# Patient Record
Sex: Female | Born: 1990 | Race: White | Hispanic: No | Smoking: Former smoker
Health system: Southern US, Community
[De-identification: ages and names within clinical notes are randomized; demographics above are authoritative.]

---

## 2016-06-15 ENCOUNTER — Emergency Department (HOSPITAL_COMMUNITY)
Admission: EM | Admit: 2016-06-15 | Discharge: 2016-06-15 | Disposition: A | Discharge status: Home / Self Care | Payer: Self-pay | Attending: Emergency Medicine | Admitting: Emergency Medicine

## 2016-06-15 ENCOUNTER — Emergency Department (HOSPITAL_COMMUNITY): Payer: Self-pay

## 2016-06-15 ENCOUNTER — Encounter (HOSPITAL_COMMUNITY): Payer: Self-pay

## 2016-06-15 DIAGNOSIS — R0789 Other chest pain: Secondary | ICD-10-CM | POA: Insufficient documentation

## 2016-06-15 DIAGNOSIS — Z87891 Personal history of nicotine dependence: Secondary | ICD-10-CM | POA: Insufficient documentation

## 2016-06-15 LAB — CBC
HEMATOCRIT: 41 % (ref 36.0–46.0)
HEMOGLOBIN: 13.1 g/dL (ref 12.0–15.0)
MCH: 23 pg — ABNORMAL LOW (ref 26.0–34.0)
MCHC: 32 g/dL (ref 30.0–36.0)
MCV: 72.1 fL — ABNORMAL LOW (ref 78.0–100.0)
Platelets: 327 10*3/uL (ref 150–400)
RBC: 5.69 MIL/uL — AB (ref 3.87–5.11)
RDW: 14.9 % (ref 11.5–15.5)
WBC: 9.7 10*3/uL (ref 4.0–10.5)

## 2016-06-15 LAB — BASIC METABOLIC PANEL
ANION GAP: 11 (ref 5–15)
BUN: 6 mg/dL (ref 6–20)
CHLORIDE: 104 mmol/L (ref 101–111)
CO2: 22 mmol/L (ref 22–32)
Calcium: 9.3 mg/dL (ref 8.9–10.3)
Creatinine, Ser: 0.85 mg/dL (ref 0.44–1.00)
GFR calc Af Amer: >60 mL/min (ref >60)
Glucose, Bld: 82 mg/dL (ref 65–99)
POTASSIUM: 3.9 mmol/L (ref 3.5–5.1)
SODIUM: 137 mmol/L (ref 135–145)

## 2016-06-15 LAB — D-DIMER, QUANTITATIVE: D-Dimer, Quant: 0.37 ug/mL-FEU (ref 0.00–0.50)

## 2016-06-15 MED ORDER — IBUPROFEN 400 MG PO TABS
600.0000 mg | ORAL_TABLET | Freq: Once | ORAL | Status: AC
  Administered 2016-06-15: 600 mg via ORAL
  Filled 2016-06-15: qty 1

## 2016-06-15 NOTE — ED Triage Notes (Signed)
Onset this morning while at work left sided chest pain constant.  Breathing in and out makes worse.  Nothing makes better.  No recent cough and cold symptoms.  Pt is tearful stating "it just hurts really bad".

## 2016-06-15 NOTE — ED Provider Notes (Signed)
[facility] Provider Note   CSN: [id number] Arrival date & time: 06/15/16  1209     History   Chief Complaint Chief Complaint  Patient presents with  . Chest Pain  . Anxiety    HPI Shirley Garrett is a 26 y.o. female who presents with chest pain. No significant PMH. She states the pain started acutely this morning. It is on the central and left side of her chest. It feels like "someone punched me in the chest". Breathing in and movement makes it worse. Nothing has made it better. She does report an anxiety attack when she was crying and had the chest pain. Also reports intermittent L calf pain for the past couple weeks but none currently. Denies fever, chills, SOB, cough, abdominal pain, N/V. She is on oral BC which she has been on since she was 13. She also endorses a lot of stress recently but states that she is more calm now and the pain is still there although it is better. Denies heavy lifting or recent exercise. Some dizziness without syncope. Reports increased stress due to recent break up, stress at her job, and a recent move.  HPI  History reviewed. No pertinent past medical history.  There are no active problems to display for this patient.   History reviewed. No pertinent surgical history.  OB History    No data available       Home Medications    Prior to Admission medications   Not on File    Family History History reviewed. No pertinent family history.  Social History Social History  Substance Use Topics  . Smoking status: Former Smoker    Types: Cigarettes  . Smokeless tobacco: Never Used  . Alcohol use No     Allergies   Patient has no known allergies.   Review of Systems Review of Systems  Constitutional: Negative for chills and fever.  Respiratory: Negative for cough and shortness of breath.   Cardiovascular: Positive for chest pain. Negative for palpitations and leg swelling.  Gastrointestinal: Negative for abdominal pain,  nausea and vomiting.  Neurological: Positive for dizziness. Negative for syncope and light-headedness.  All other systems reviewed and are negative.    Physical Exam Updated Vital Signs BP 135/99 (BP Location: Left Arm)   Pulse 93   Temp 98.9 F (37.2 C) (Oral)   Resp 24   Ht 5' (1.524 m)   Wt 99.8 kg   LMP 05/13/2016   SpO2 100%   BMI 42.97 kg/m   Physical Exam  Constitutional: She is oriented to person, place, and time. She appears well-developed and well-nourished. No distress.  HENT:  Head: Normocephalic and atraumatic.  Eyes: Conjunctivae are normal. Pupils are equal, round, and reactive to light. Right eye exhibits no discharge. Left eye exhibits no discharge. No scleral icterus.  Neck: Normal range of motion.  Cardiovascular: Normal rate and regular rhythm.  Exam reveals no gallop and no friction rub.   No murmur heard. Pulmonary/Chest: Effort normal. No respiratory distress. She has no wheezes. She has no rales. She exhibits tenderness (Sternal and left sided chest tenderness).  Abdominal: Soft. Bowel sounds are normal. She exhibits no distension and no mass. There is no tenderness. There is no rebound and no guarding. No hernia.  Musculoskeletal: She exhibits no edema.  No calf swelling or tenderness  Neurological: She is alert and oriented to person, place, and time.  Skin: Skin is warm and dry.  Psychiatric: She has a normal mood and  affect. Her behavior is normal.  Nursing note and vitals reviewed.    ED Treatments / Results  Labs (all labs ordered are listed, but only abnormal results are displayed) Labs Reviewed  CBC - Abnormal; Notable for the following:       Result Value   RBC 5.69 (*)    MCV 72.1 (*)    MCH 23.0 (*)    All other components within normal limits  BASIC METABOLIC PANEL  D-DIMER, QUANTITATIVE (NOT AT [facility])    EKG  EKG Interpretation None       Radiology Dg Chest 2 View  Result Date: 06/15/2016 CLINICAL DATA:  Left-sided  chest pain. EXAM: CHEST  2 VIEW COMPARISON:  None. FINDINGS: The heart size and mediastinal contours are within normal limits. There is no evidence of pulmonary edema, consolidation, pneumothorax, nodule or pleural fluid. The visualized skeletal structures are unremarkable. IMPRESSION: No active cardiopulmonary disease. Electronically Signed   By: Irish Lack M.D.   On: 06/15/2016 14:27    Procedures Procedures (including critical care time)  Medications Ordered in ED Medications  ibuprofen (ADVIL,MOTRIN) tablet 600 mg (600 mg Oral Given 06/15/16 1606)     Initial Impression / Assessment and Plan / ED Course  I have reviewed the triage vital signs and the nursing notes.  Pertinent labs & imaging results that were available during my care of the patient were reviewed by me and considered in my medical decision making (see chart for details).  26 year old female with chest pain likely MSK vs anxiety. Patient is afebrile, not tachycardic or tachypneic, normotensive, and not hypoxic. EKG is NSR. CXR negative. Labs unremarkable. D-dimer negative. Ibuprofen given for pain. Offered counseling resources - pt declined. Patient is NAD, non-toxic, with stable VS. Patient is informed of clinical course, understands medical decision making process, and agrees with plan. Opportunity for questions provided and all questions answered. Return precautions given.  Final Clinical Impressions(s) / ED Diagnoses   Final diagnoses:  Chest wall pain    New Prescriptions There are no discharge medications for this patient.    Bethel Born, PA-C 06/16/16 1553    Lorre Nick, MD 06/19/16 [phone]

## 2016-06-15 NOTE — ED Triage Notes (Signed)
Pt reports she is under a lot of stress and thinks she "just lost it this morning".

## 2016-06-15 NOTE — Discharge Instructions (Signed)
Take Ibuprofen 600mg  3-4 times daily for the next week Follow up with your family doctor regarding anxiety

## 2018-01-22 IMAGING — DX DG CHEST 2V
2 series · 2 of 2 positions shown · non-contrast
Comparison: None.

CLINICAL DATA: Left-sided chest pain.

EXAM:
CHEST  2 VIEW

[w chest pa]
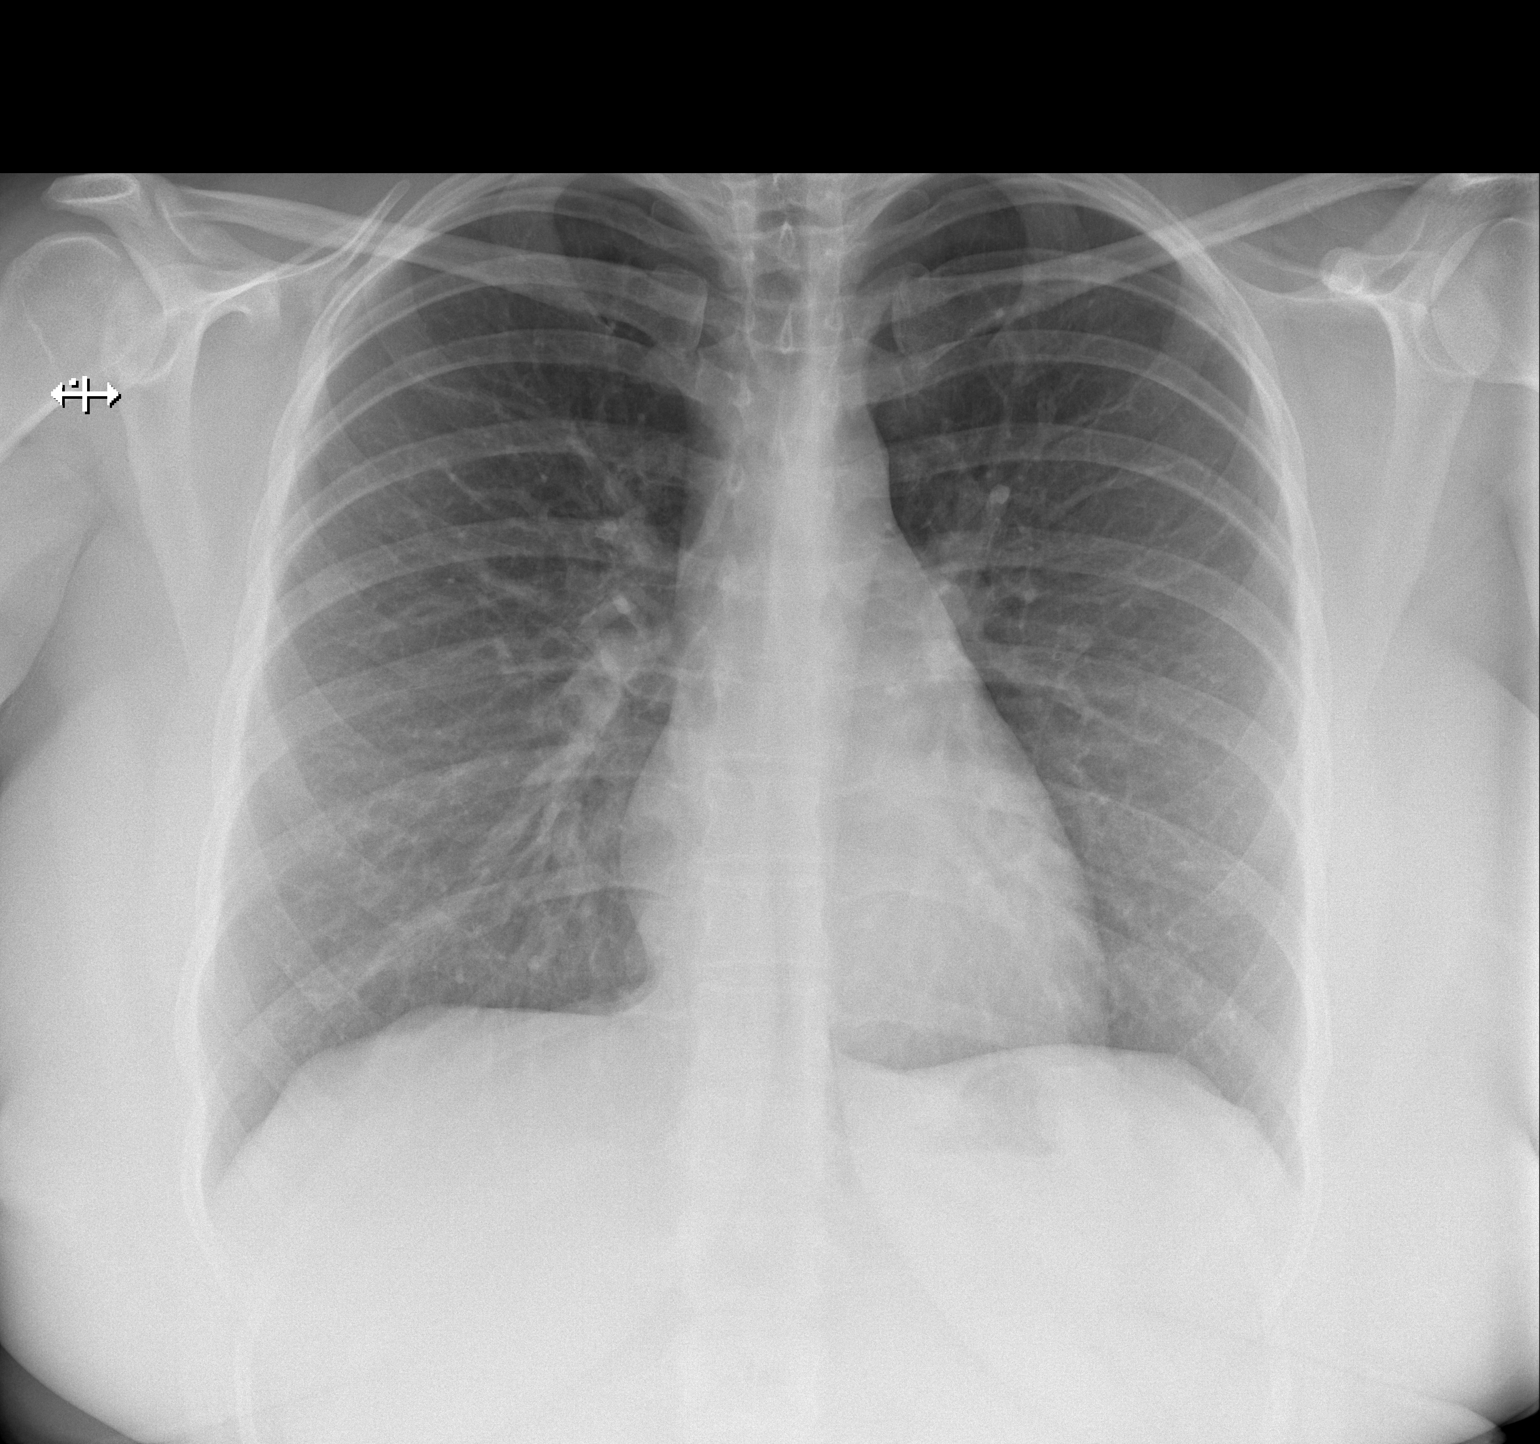

[w chest lat]
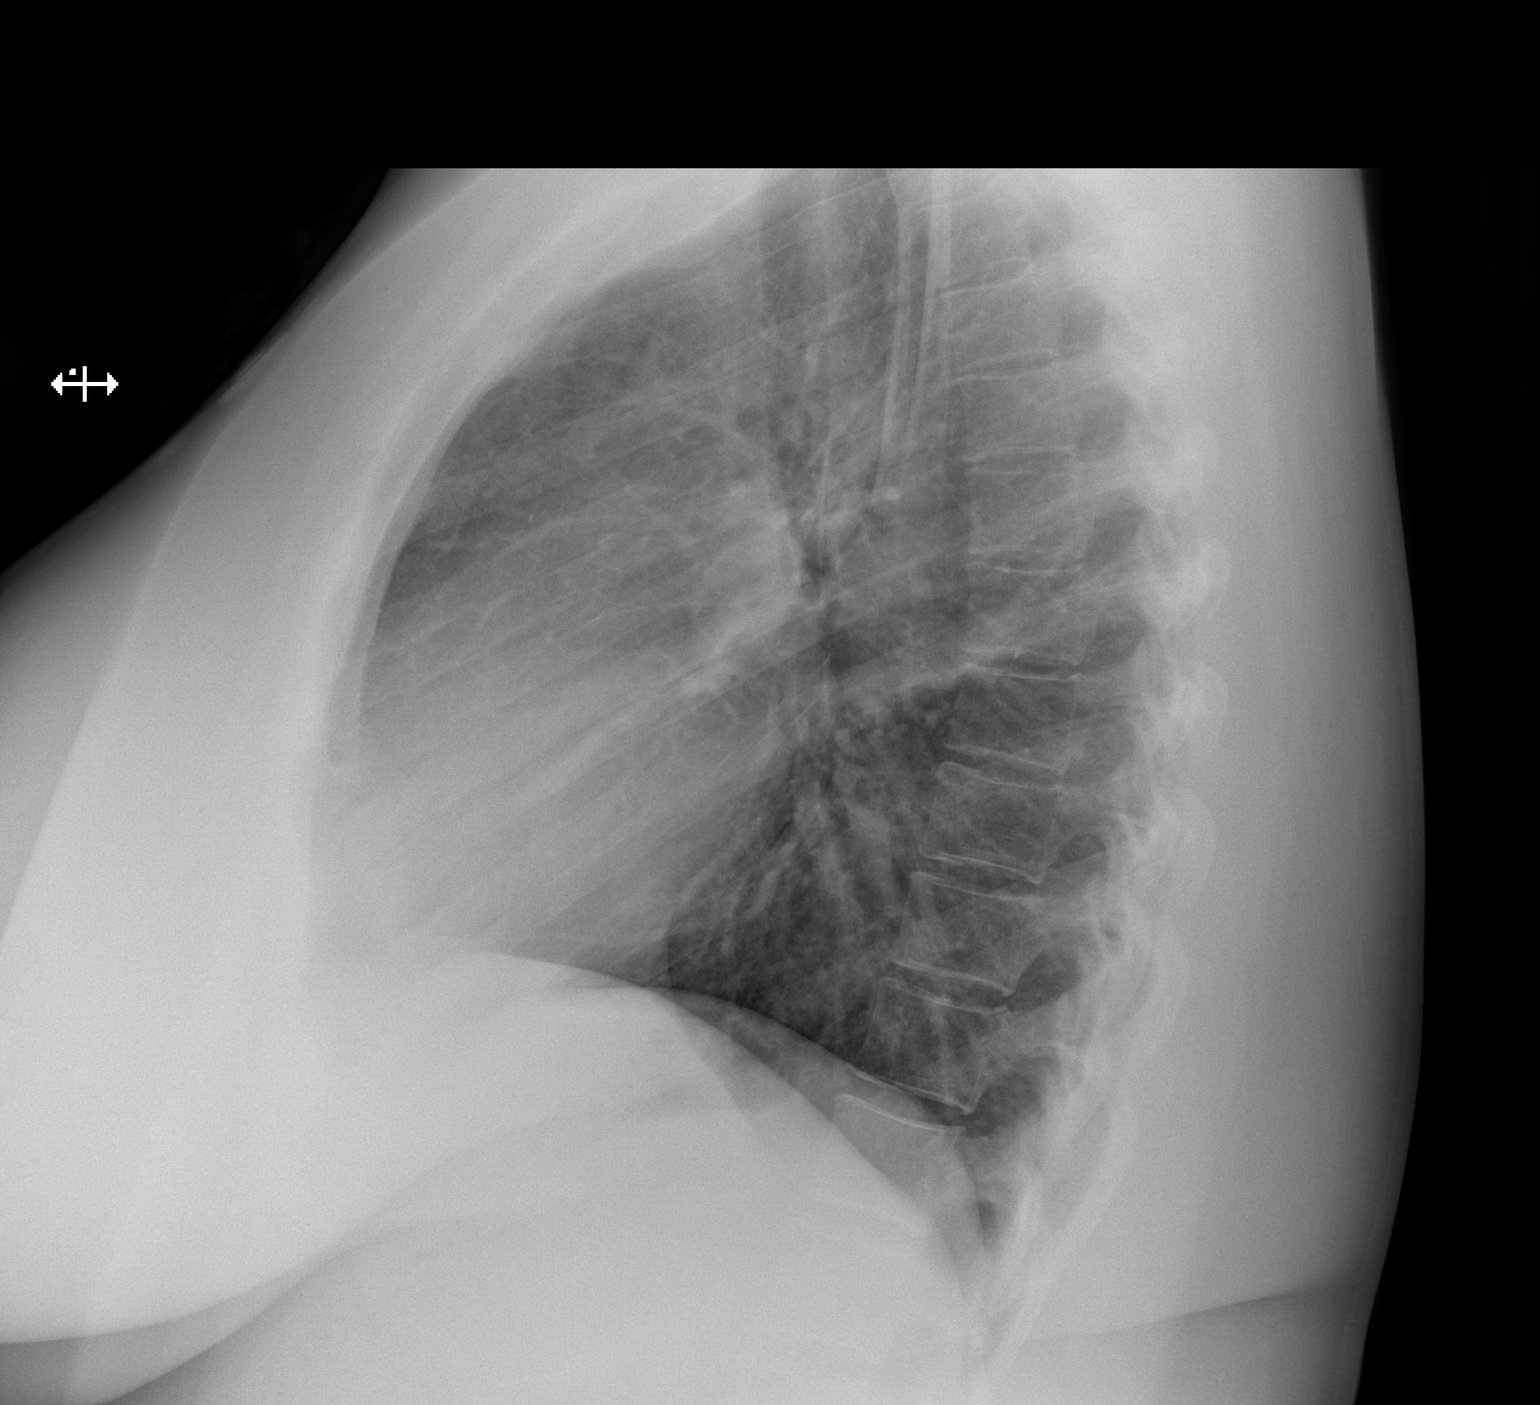

[2 of 2 positions shown; findings below may reference images not displayed]

FINDINGS: The heart size and mediastinal contours are within normal limits.
There is no evidence of pulmonary edema, consolidation,
pneumothorax, nodule or pleural fluid. The visualized skeletal
structures are unremarkable.
IMPRESSION: No active cardiopulmonary disease.

## 2019-12-09 ENCOUNTER — Encounter (HOSPITAL_COMMUNITY): Payer: Self-pay | Admitting: Emergency Medicine

## 2019-12-09 ENCOUNTER — Ambulatory Visit (HOSPITAL_COMMUNITY)
Admission: EM | Admit: 2019-12-09 | Discharge: 2019-12-09 | Disposition: A | Discharge status: Home / Self Care | Payer: BC Managed Care – PPO | Attending: Family Medicine | Admitting: Family Medicine

## 2019-12-09 ENCOUNTER — Other Ambulatory Visit: Payer: Self-pay

## 2019-12-09 DIAGNOSIS — L500 Allergic urticaria: Secondary | ICD-10-CM | POA: Diagnosis not present

## 2019-12-09 MED ORDER — PREDNISONE 20 MG PO TABS: 20.0000 mg | ORAL_TABLET | Freq: Two times a day (BID) | ORAL | 0 refills | Status: AC

## 2019-12-09 NOTE — ED Triage Notes (Addendum)
Received covid vaccine 3 weeks ago Patient reports hives started 1 1/2 weeks later Patient has insect bites/hives all over trunk and extremities.  Reports apartment has been evaluated for bugs, etc and none found.    The only change was vaccine per patient  Patient is due for second shot this sunday

## 2019-12-09 NOTE — Discharge Instructions (Addendum)
Take Zyrtec 1 pill 2 times a day Take Benadryl if needed at bedtime Both of these medicines are for itching Take prednisone 2 times a day for 5 days See allergy specialist if the hives persist

## 2019-12-09 NOTE — ED Provider Notes (Signed)
[facility]    CSN: [id number] Arrival date & time: 12/09/19  1014      History   Chief Complaint Chief Complaint  Patient presents with  . Urticaria    HPI Shirley Garrett is a 29 y.o. female.   HPI  Patient has had hives for a week and a half  She cannot think of any new toiletry product, medication, supplements that she might be allergic to. She states she had "bad allergies" as a child and had allergy testing.  Allergy shots.  Has not had them at all since being an adult Patient states that she had her first Covid vaccine 3 weeks ago.  It is the only new change in her life that she can think of.  The hives started a week and a half later. She has been using cortisone cream.  Benadryl cream.  Little improvement  History reviewed. No pertinent past medical history.  There are no problems to display for this patient.   History reviewed. No pertinent surgical history.  OB History   No obstetric history on file.      Home Medications    Prior to Admission medications   Medication Sig Start Date End Date Taking? Authorizing Provider  diphenhydrAMINE (BENADRYL) 2 % cream Apply topically 3 (three) times daily as needed for itching.   Yes [provider]  predniSONE (DELTASONE) 20 MG tablet Take 1 tablet (20 mg total) by mouth 2 (two) times daily with a meal. 12/09/19   Eustace Moore, MD    Family History Family History  Problem Relation Age of Onset  . Hypertension Father     Social History Social History   Tobacco Use  . Smoking status: Former Smoker    Types: Cigarettes  . Smokeless tobacco: Never Used  Substance Use Topics  . Alcohol use: No  . Drug use: Yes    Types: Marijuana    Comment: occ      Allergies   Patient has no known allergies.   Review of Systems Review of Systems See HPI  Physical Exam Triage Vital Signs ED Triage Vitals  Enc Vitals Group     BP 12/09/19 1141 (!) 140/98     Pulse Rate 12/09/19  1141 77     Resp 12/09/19 1141 18     Temp 12/09/19 1141 98.8 F (37.1 C)     Temp Source 12/09/19 1141 Oral     SpO2 12/09/19 1141 100 %     Weight --      Height --      Head Circumference --      Peak Flow --      Pain Score 12/09/19 1137 0     Pain Loc --      Pain Edu? --      Excl. in GC? --    No data found.  Updated Vital Signs BP (!) 140/98 (BP Location: Right Arm) Comment (BP Location): large cuff  Pulse 77   Temp 98.8 F (37.1 C) (Oral)   Resp 18   LMP 11/11/2019   SpO2 100%      Physical Exam Constitutional:      General: She is not in acute distress.    Appearance: She is well-developed.  HENT:     Head: Normocephalic and atraumatic.  Eyes:     Conjunctiva/sclera: Conjunctivae normal.     Pupils: Pupils are equal, round, and reactive to light.  Cardiovascular:     Rate and  Rhythm: Normal rate.  Pulmonary:     Effort: Pulmonary effort is normal. No respiratory distress.     Breath sounds: Normal breath sounds.  Musculoskeletal:        General: Normal range of motion.     Cervical back: Normal range of motion.  Skin:    General: Skin is warm and dry.     Comments: Patient has multiple erythematous wheals that measure 2 to 3 cm across scattered on trunk and extremities.  None on face.  Neurological:     General: No focal deficit present.     Mental Status: She is alert.  Psychiatric:        Mood and Affect: Mood normal.        Behavior: Behavior normal.      UC Treatments / Results  Labs (all labs ordered are listed, but only abnormal results are displayed) Labs Reviewed - No data to display  EKG   Radiology No results found.  Procedures Procedures (including critical care time)  Medications Ordered in UC Medications - No data to display  Initial Impression / Assessment and Plan / UC Course  I have reviewed the triage vital signs and the nursing notes.  Pertinent labs & imaging results that were available during my care of the  patient were reviewed by me and considered in my medical decision making (see chart for details).     Viewed that there are multiple causes of hives.  Often difficult to determine etiology.  Treatment is reviewed. Final Clinical Impressions(s) / UC Diagnoses   Final diagnoses:  Allergic urticaria     Discharge Instructions     Take Zyrtec 1 pill 2 times a day Take Benadryl if needed at bedtime Both of these medicines are for itching Take prednisone 2 times a day for 5 days See allergy specialist if the hives persist   ED Prescriptions    Medication Sig Dispense Auth. Provider   predniSONE (DELTASONE) 20 MG tablet Take 1 tablet (20 mg total) by mouth 2 (two) times daily with a meal. 10 tablet Eustace Moore, MD     PDMP not reviewed this encounter.   Eustace Moore, MD 12/09/19 [phone]
